# Patient Record
Sex: Male | Born: 1990 | Race: Black or African American | Hispanic: No | Marital: Single | Smoking: Never smoker
Health system: Southern US, Community
[De-identification: ages and names within clinical notes are randomized; demographics above are authoritative.]

---

## 2013-02-26 ENCOUNTER — Emergency Department: Payer: Self-pay | Admitting: Emergency Medicine

## 2013-02-26 LAB — COMPREHENSIVE METABOLIC PANEL
Albumin: 4.2 g/dL (ref 3.4–5.0)
Alkaline Phosphatase: 110 U/L (ref 50–136)
Anion Gap: 7 (ref 7–16)
BUN: 12 mg/dL (ref 7–18)
Calcium, Total: 9.5 mg/dL (ref 8.5–10.1)
Chloride: 102 mmol/L (ref 98–107)
EGFR (Non-African Amer.): >60
SGOT(AST): 50 U/L — ABNORMAL HIGH (ref 15–37)
SGPT (ALT): 74 U/L (ref 12–78)
Sodium: 138 mmol/L (ref 136–145)
Total Protein: 8 g/dL (ref 6.4–8.2)

## 2013-02-26 LAB — CBC WITH DIFFERENTIAL/PLATELET
Basophil #: 0 10*3/uL (ref 0.0–0.1)
Eosinophil %: 0.4 %
HGB: 15.3 g/dL (ref 13.0–18.0)
Lymphocyte #: 1 10*3/uL (ref 1.0–3.6)
MCH: 30.5 pg (ref 26.0–34.0)
MCHC: 34.7 g/dL (ref 32.0–36.0)
Monocyte #: 0.7 x10 3/mm (ref 0.2–1.0)
Monocyte %: 7.7 %
RBC: 5.02 10*6/uL (ref 4.40–5.90)
RDW: 13.7 % (ref 11.5–14.5)
WBC: 8.9 10*3/uL (ref 3.8–10.6)

## 2013-10-09 ENCOUNTER — Emergency Department: Payer: Self-pay | Admitting: Emergency Medicine

## 2013-10-09 LAB — CBC
HCT: 45.8 % (ref 40.0–52.0)
HGB: 14.9 g/dL (ref 13.0–18.0)
MCH: 29.8 pg (ref 26.0–34.0)
MCHC: 32.6 g/dL (ref 32.0–36.0)
MCV: 91 fL (ref 80–100)
Platelet: 239 10*3/uL (ref 150–440)
RBC: 5.02 10*6/uL (ref 4.40–5.90)
RDW: 13.7 % (ref 11.5–14.5)
WBC: 11.1 10*3/uL — AB (ref 3.8–10.6)

## 2013-10-09 LAB — COMPREHENSIVE METABOLIC PANEL
ALBUMIN: 4.2 g/dL (ref 3.4–5.0)
AST: 43 U/L — AB (ref 15–37)
Alkaline Phosphatase: 94 U/L
Anion Gap: 4 — ABNORMAL LOW (ref 7–16)
BUN: 9 mg/dL (ref 7–18)
Bilirubin,Total: 0.5 mg/dL (ref 0.2–1.0)
CALCIUM: 8.6 mg/dL (ref 8.5–10.1)
Chloride: 106 mmol/L (ref 98–107)
Co2: 29 mmol/L (ref 21–32)
Creatinine: 1.11 mg/dL (ref 0.60–1.30)
EGFR (African American): >60
EGFR (Non-African Amer.): >60
GLUCOSE: 132 mg/dL — AB (ref 65–99)
Osmolality: 278 (ref 275–301)
POTASSIUM: 3.7 mmol/L (ref 3.5–5.1)
SGPT (ALT): 40 U/L (ref 12–78)
SODIUM: 139 mmol/L (ref 136–145)
TOTAL PROTEIN: 8.2 g/dL (ref 6.4–8.2)

## 2013-10-09 LAB — LIPASE, BLOOD: Lipase: 116 U/L (ref 73–393)

## 2014-06-26 ENCOUNTER — Emergency Department: Payer: Self-pay | Admitting: Emergency Medicine

## 2016-01-12 ENCOUNTER — Encounter: Payer: Self-pay | Admitting: Emergency Medicine

## 2016-01-12 ENCOUNTER — Emergency Department
Admission: EM | Admit: 2016-01-12 | Discharge: 2016-01-13 | Disposition: A | Discharge status: Home / Self Care | Payer: BLUE CROSS/BLUE SHIELD | Attending: Emergency Medicine | Admitting: Emergency Medicine

## 2016-01-12 DIAGNOSIS — R410 Disorientation, unspecified: Secondary | ICD-10-CM | POA: Insufficient documentation

## 2016-01-12 DIAGNOSIS — T50901A Poisoning by unspecified drugs, medicaments and biological substances, accidental (unintentional), initial encounter: Secondary | ICD-10-CM | POA: Diagnosis present

## 2016-01-12 DIAGNOSIS — F1210 Cannabis abuse, uncomplicated: Secondary | ICD-10-CM | POA: Diagnosis not present

## 2016-01-12 MED ORDER — SODIUM CHLORIDE 0.9 % IV BOLUS (SEPSIS)
1000.0000 mL | Freq: Once | INTRAVENOUS | Status: AC
  Administered 2016-01-13: 1000 mL via INTRAVENOUS

## 2016-01-12 NOTE — ED Notes (Signed)
Pt arrived to the ED via [facility] for an accidental overdose. EMS reports that when they got to the scene the Pt was unconscious, they placed an IV catheter and gave fluids and narcan and the Pt became alert. Pt states that he smoked weed and started to feel weird after. Pt is AOx4 in no apparent distress mildly intoxicated.

## 2016-01-13 LAB — COMPREHENSIVE METABOLIC PANEL
ALBUMIN: 4.3 g/dL (ref 3.5–5.0)
ALK PHOS: 83 U/L (ref 38–126)
ALT: 24 U/L (ref 17–63)
ANION GAP: 12 (ref 5–15)
AST: 39 U/L (ref 15–41)
BUN: 10 mg/dL (ref 6–20)
CHLORIDE: 106 mmol/L (ref 101–111)
CO2: 21 mmol/L — AB (ref 22–32)
Calcium: 8.9 mg/dL (ref 8.9–10.3)
Creatinine, Ser: 1.17 mg/dL (ref 0.61–1.24)
GFR calc non Af Amer: >60 mL/min (ref >60)
GLUCOSE: 170 mg/dL — AB (ref 65–99)
POTASSIUM: 3.1 mmol/L — AB (ref 3.5–5.1)
SODIUM: 139 mmol/L (ref 135–145)
Total Bilirubin: 0.4 mg/dL (ref 0.3–1.2)
Total Protein: 7.6 g/dL (ref 6.5–8.1)

## 2016-01-13 LAB — URINE DRUG SCREEN, QUALITATIVE (ARMC ONLY)
AMPHETAMINES, UR SCREEN: NOT DETECTED
BENZODIAZEPINE, UR SCRN: NOT DETECTED
Barbiturates, Ur Screen: NOT DETECTED
COCAINE METABOLITE, UR ~~LOC~~: NOT DETECTED
Cannabinoid 50 Ng, Ur ~~LOC~~: POSITIVE — AB
MDMA (ECSTASY) UR SCREEN: NOT DETECTED
METHADONE SCREEN, URINE: NOT DETECTED
OPIATE, UR SCREEN: NOT DETECTED
PHENCYCLIDINE (PCP) UR S: NOT DETECTED
Tricyclic, Ur Screen: NOT DETECTED

## 2016-01-13 LAB — CBC
HEMATOCRIT: 41.3 % (ref 40.0–52.0)
HEMOGLOBIN: 14.1 g/dL (ref 13.0–18.0)
MCH: 31.1 pg (ref 26.0–34.0)
MCHC: 34.1 g/dL (ref 32.0–36.0)
MCV: 91.4 fL (ref 80.0–100.0)
Platelets: 291 10*3/uL (ref 150–440)
RBC: 4.52 MIL/uL (ref 4.40–5.90)
RDW: 14.1 % (ref 11.5–14.5)
WBC: 11.9 10*3/uL — AB (ref 3.8–10.6)
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
## 2016-01-13 NOTE — Discharge Instructions (Signed)
Delirium °Delirium is a state of mental confusion. It comes on quickly and causes significant changes in a person's thinking and behavior. People with delirium usually have trouble paying attention to what is going on or knowing where they are. They may become very withdrawn or very emotional and unable to sit still. They may even see or feel things that are not there (hallucinations). Delirium is a sign of a serious underlying medical condition. °CAUSES °Delirium occurs when something suddenly affects the signals that the brain sends out. Brain signals can be affected by anything that puts severe stress on the body and brain and causes brain chemicals to be out of balance. The most common causes of delirium include: °· Infections. These may be bacterial, viral, fungal, or protozoal. °· Medicines. These include many over-the-counter and prescription medicines. °· Recreational drugs. °· Substance withdrawal. This occurs with sudden discontinuation of alcohol, certain medicines, or recreational drugs. °· Surgery. °· Sudden vascular events, such as stroke, brain hemorrhage, and severe migraine. °· Other brain disorders, such as tumors, seizures, and physical head trauma. °· Metabolic disorders, such as kidney or liver failure. °· Low blood oxygen (anoxia). This may occur with lung disease, cardiac arrest, or carbon monoxide poisoning. °· Hormone imbalances (endocrinopathies), such as an overactive thyroid (hyperthyroidism) or underactive thyroid (hypothyroidism). °· Vitamin deficiencies. °RISK FACTORS °This condition is more likely to develop in: °· Children. °· Older people. °· People who live alone. °· People who have vision loss or hearing loss. °· People who have existing brain disease, such as dementia. °· People who have long-lasting (chronic) medical conditions, such as heart disease. °· People who are hospitalized for long periods of time. °SYMPTOMS °Delirium starts with a sudden change in a person's thinking  or behavior. Symptoms come and go (fluctuate) over time, and they are often worse at the end of the day. Symptoms include: °· Not being able to stay awake (drowsiness) or pay attention. °· Being confused about places, time, and people. °· Forgetfulness. °· Having extreme energy levels. These may be low or high. °· Changes in sleep patterns. °· Extreme mood swings, such as anger or anxiety. °· Focusing on things or ideas that are not important. °· Rambling and senseless talking. °· Difficulty speaking, understanding speech, or both. °· Hallucinations. °· Tremor or unsteady gait. °DIAGNOSIS °People with delirium may not realize that they have the condition. Often, a family member or health care provider is the first person to notice the changes. The health care provider will obtain a detailed history of current symptoms, medical issues, medicines, and recreational drug use. The health care provider will perform a mental status examination by: °· Asking questions to check for confusion. °· Watching for abnormal behavior. °The health care provider may perform a physical exam and order lab tests or additional studies to determine the cause of the delirium. °TREATMENT °Treatment of delirium depends on the cause and severity. Delirium usually goes away within days or weeks of treating the underlying cause. In the meantime, the person should not be left alone because he or she may accidentally cause self-harm. Treatment includes supportive care, such as: °· Increased light during the day and decreased light at night. °· Low noise level. °· Uninterrupted sleep. °· A regular daily schedule. °· Clocks and calendars to help with orientation. °· Familiar objects, including the person's pictures and clothing. °· Frequent visits from familiar family and friends. °· Healthy diet. °· Exercise. °In more severe cases of delirium, medicine may be prescribed   to help the person to keep calm and think more clearly. °HOME CARE  INSTRUCTIONS °· Any supportive care should be continued as told by the health care provider. °· All medicines should be used as told by the health care provider. This is important. °· The health care provider should be consulted before over-the-counter medicines, herbs, or supplements are used. °· All follow-up visits should be kept as told by the health care provider. This is important. °· Alcohol and recreational drugs should be avoided as told by the health care provider. °SEEK MEDICAL CARE IF: °· Symptoms do not get better or they become worse. °· New symptoms of delirium develop. °· Caring for the person at home does not seem safe. °· Eating, drinking, or communicating stops. °· There are side effects of medicines, such as changes in sleep patterns, dizziness, weight gain, restlessness, movement changes, or tremors. °SEEK IMMEDIATE MEDICAL CARE IF: °· Serious thoughts occur about self-harm or about hurting others. °· There are serious side effects of medicine, such as: °¨ Swelling of the face, lips, tongue, or throat. °¨ Fever, confusion, muscle spasms, or seizures. °  °This information is not intended to replace advice given to you by your health care provider. Make sure you discuss any questions you have with your health care provider. °  °Document Released: 03/19/2012 Document Revised: 11/09/2014 Document Reviewed: 08/18/2014 °Elsevier Interactive Patient Education ©2016 [employer] ° °

## 2017-12-15 ENCOUNTER — Other Ambulatory Visit: Payer: Self-pay

## 2017-12-15 DIAGNOSIS — Y939 Activity, unspecified: Secondary | ICD-10-CM | POA: Insufficient documentation

## 2017-12-15 DIAGNOSIS — Y999 Unspecified external cause status: Secondary | ICD-10-CM | POA: Diagnosis not present

## 2017-12-15 DIAGNOSIS — S3991XA Unspecified injury of abdomen, initial encounter: Secondary | ICD-10-CM | POA: Diagnosis not present

## 2017-12-15 DIAGNOSIS — S161XXA Strain of muscle, fascia and tendon at neck level, initial encounter: Secondary | ICD-10-CM | POA: Insufficient documentation

## 2017-12-15 DIAGNOSIS — Y92410 Unspecified street and highway as the place of occurrence of the external cause: Secondary | ICD-10-CM | POA: Diagnosis not present

## 2017-12-15 DIAGNOSIS — S0990XA Unspecified injury of head, initial encounter: Secondary | ICD-10-CM | POA: Diagnosis present

## 2017-12-15 LAB — URINALYSIS, COMPLETE (UACMP) WITH MICROSCOPIC
BACTERIA UA: NONE SEEN
Bilirubin Urine: NEGATIVE
GLUCOSE, UA: NEGATIVE mg/dL
Hgb urine dipstick: NEGATIVE
Ketones, ur: NEGATIVE mg/dL
Leukocytes, UA: NEGATIVE
NITRITE: NEGATIVE
PROTEIN: NEGATIVE mg/dL
Specific Gravity, Urine: 1.016 (ref 1.005–1.030)
pH: 7 (ref 5.0–8.0)

## 2017-12-15 LAB — CBC
HCT: 42 % (ref 40.0–52.0)
Hemoglobin: 14.8 g/dL (ref 13.0–18.0)
MCH: 32.6 pg (ref 26.0–34.0)
MCHC: 35.2 g/dL (ref 32.0–36.0)
MCV: 92.7 fL (ref 80.0–100.0)
Platelets: 317 10*3/uL (ref 150–440)
RBC: 4.54 MIL/uL (ref 4.40–5.90)
RDW: 14.3 % (ref 11.5–14.5)
WBC: 7.3 10*3/uL (ref 3.8–10.6)

## 2017-12-15 LAB — COMPREHENSIVE METABOLIC PANEL
ALBUMIN: 4.3 g/dL (ref 3.5–5.0)
ALK PHOS: 86 U/L (ref 38–126)
ALT: 21 U/L (ref 17–63)
AST: 32 U/L (ref 15–41)
Anion gap: 8 (ref 5–15)
BILIRUBIN TOTAL: 0.4 mg/dL (ref 0.3–1.2)
BUN: 10 mg/dL (ref 6–20)
CALCIUM: 9.4 mg/dL (ref 8.9–10.3)
CO2: 25 mmol/L (ref 22–32)
Chloride: 104 mmol/L (ref 101–111)
Creatinine, Ser: 1.02 mg/dL (ref 0.61–1.24)
GFR calc Af Amer: >60 mL/min (ref >60)
GFR calc non Af Amer: >60 mL/min (ref >60)
Glucose, Bld: 98 mg/dL (ref 65–99)
Potassium: 3.7 mmol/L (ref 3.5–5.1)
Sodium: 137 mmol/L (ref 135–145)
TOTAL PROTEIN: 7.6 g/dL (ref 6.5–8.1)

## 2017-12-15 LAB — LIPASE, BLOOD: Lipase: 37 U/L (ref 11–51)
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
## 2017-12-15 NOTE — ED Notes (Signed)
Triage documented under Alissa S, EDT in error.  Obtained and documented by this RN.
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
## 2017-12-16 ENCOUNTER — Encounter: Payer: Self-pay | Admitting: Radiology

## 2017-12-16 ENCOUNTER — Emergency Department: Payer: No Typology Code available for payment source

## 2017-12-16 ENCOUNTER — Emergency Department
Admission: EM | Admit: 2017-12-16 | Discharge: 2017-12-16 | Disposition: A | Discharge status: Home / Self Care | Payer: No Typology Code available for payment source | Attending: Emergency Medicine | Admitting: Emergency Medicine

## 2017-12-16 DIAGNOSIS — S161XXA Strain of muscle, fascia and tendon at neck level, initial encounter: Secondary | ICD-10-CM

## 2017-12-16 DIAGNOSIS — S3991XA Unspecified injury of abdomen, initial encounter: Secondary | ICD-10-CM

## 2017-12-16 MED ORDER — HYDROCODONE-ACETAMINOPHEN 5-325 MG PO TABS: 1.0000 | ORAL_TABLET | Freq: Four times a day (QID) | ORAL | 0 refills | Status: AC | PRN

## 2017-12-16 MED ORDER — FENTANYL CITRATE (PF) 100 MCG/2ML IJ SOLN
50.0000 ug | Freq: Once | INTRAMUSCULAR | Status: AC
  Administered 2017-12-16: 50 ug via INTRAVENOUS
  Filled 2017-12-16: qty 2

## 2017-12-16 MED ORDER — CYCLOBENZAPRINE HCL 5 MG PO TABS: ORAL_TABLET | ORAL | 0 refills | Status: AC

## 2017-12-16 MED ORDER — ONDANSETRON HCL 4 MG/2ML IJ SOLN
4.0000 mg | Freq: Once | INTRAMUSCULAR | Status: AC
  Administered 2017-12-16: 4 mg via INTRAVENOUS
  Filled 2017-12-16: qty 2

## 2017-12-16 MED ORDER — IOHEXOL 300 MG/ML  SOLN
100.0000 mL | Freq: Once | INTRAMUSCULAR | Status: AC | PRN
  Administered 2017-12-16: 100 mL via INTRAVENOUS

## 2017-12-16 MED ORDER — SODIUM CHLORIDE 0.9 % IV BOLUS
1000.0000 mL | Freq: Once | INTRAVENOUS | Status: AC
  Administered 2017-12-16: 1000 mL via INTRAVENOUS

## 2017-12-16 MED ORDER — IBUPROFEN 600 MG PO TABS: 600.0000 mg | ORAL_TABLET | Freq: Three times a day (TID) | ORAL | 0 refills | Status: AC | PRN
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
## 2017-12-16 NOTE — Discharge Instructions (Addendum)
1.  You may take medicines as needed for pain and muscle spasms (Motrin/Norco/Flexeril #15). 2.  Return to the ER for worsening symptoms, persistent vomiting, difficulty breathing or other concerns.

## 2017-12-16 NOTE — ED Notes (Signed)
ED Provider at bedside. 
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
